# Patient Record
Sex: Male | Born: 1970 | Race: Black or African American | Hispanic: No | Marital: Single | Smoking: Current every day smoker
Health system: Southern US, Community
[De-identification: ages and names within clinical notes are randomized; demographics above are authoritative.]

---

## 1999-09-17 ENCOUNTER — Emergency Department (HOSPITAL_COMMUNITY): Admission: EM | Admit: 1999-09-17 | Discharge: 1999-09-18 | Payer: Self-pay | Admitting: Emergency Medicine

## 2001-04-28 ENCOUNTER — Emergency Department (HOSPITAL_COMMUNITY): Admission: EM | Admit: 2001-04-28 | Discharge: 2001-04-28 | Payer: Self-pay | Admitting: Emergency Medicine

## 2001-05-06 ENCOUNTER — Emergency Department (HOSPITAL_COMMUNITY): Admission: EM | Admit: 2001-05-06 | Discharge: 2001-05-06 | Payer: Self-pay | Admitting: Emergency Medicine

## 2007-11-16 ENCOUNTER — Ambulatory Visit: Payer: Self-pay | Admitting: Family Medicine

## 2009-10-26 ENCOUNTER — Emergency Department (HOSPITAL_COMMUNITY): Admission: EM | Admit: 2009-10-26 | Discharge: 2009-10-26 | Payer: Self-pay | Admitting: Emergency Medicine

## 2009-11-15 ENCOUNTER — Emergency Department (HOSPITAL_COMMUNITY)
Admission: EM | Admit: 2009-11-15 | Discharge: 2009-11-15 | Payer: Self-pay | Source: Home / Self Care | Admitting: Emergency Medicine

## 2012-09-28 ENCOUNTER — Ambulatory Visit: Payer: Self-pay

## 2014-11-06 ENCOUNTER — Emergency Department (HOSPITAL_COMMUNITY)
Admission: EM | Admit: 2014-11-06 | Discharge: 2014-11-06 | Disposition: A | Discharge status: Home / Self Care | Payer: Self-pay | Attending: Emergency Medicine | Admitting: Emergency Medicine

## 2014-11-06 ENCOUNTER — Encounter (HOSPITAL_COMMUNITY): Payer: Self-pay | Admitting: Emergency Medicine

## 2014-11-06 DIAGNOSIS — Z720 Tobacco use: Secondary | ICD-10-CM | POA: Insufficient documentation

## 2014-11-06 DIAGNOSIS — S0011XA Contusion of right eyelid and periocular area, initial encounter: Secondary | ICD-10-CM | POA: Insufficient documentation

## 2014-11-06 DIAGNOSIS — H05231 Hemorrhage of right orbit: Secondary | ICD-10-CM

## 2014-11-06 DIAGNOSIS — Y92002 Bathroom of unspecified non-institutional (private) residence single-family (private) house as the place of occurrence of the external cause: Secondary | ICD-10-CM | POA: Insufficient documentation

## 2014-11-06 DIAGNOSIS — Y9389 Activity, other specified: Secondary | ICD-10-CM | POA: Insufficient documentation

## 2014-11-06 DIAGNOSIS — W2209XA Striking against other stationary object, initial encounter: Secondary | ICD-10-CM | POA: Insufficient documentation

## 2014-11-06 DIAGNOSIS — Y998 Other external cause status: Secondary | ICD-10-CM | POA: Insufficient documentation

## 2014-11-06 MED ORDER — FLUORESCEIN SODIUM 1 MG OP STRP
1.0000 | ORAL_STRIP | Freq: Once | OPHTHALMIC | Status: AC
  Administered 2014-11-06: 1 via OPHTHALMIC
  Filled 2014-11-06: qty 1

## 2014-11-06 MED ORDER — TETRACAINE HCL 0.5 % OP SOLN
1.0000 [drp] | Freq: Once | OPHTHALMIC | Status: AC
  Administered 2014-11-06: 1 [drp] via OPHTHALMIC
  Filled 2014-11-06: qty 2
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
## 2014-11-06 NOTE — Discharge Instructions (Signed)
Eye Contusion °An eye contusion is a deep bruise of the eye. This is often called a "black eye." Contusions are the result of an injury that caused bleeding under the skin. The contusion may turn blue, purple, or yellow. Minor injuries will give you a painless contusion, but more severe contusions may stay painful and swollen for a few weeks. If the eye contusion only involves the eyelids and tissues around the eye, the injured area will get better within a few days to weeks. However, eye contusions can be serious and affect the eyeball and sight. °CAUSES  °· Blunt injury or trauma to the face or eye area. °· A forehead injury that causes the blood under the skin to work its way down to the eyelids. °· Rubbing the eyes due to irritation. °SYMPTOMS  °· Swelling and redness around the eye. °· Bruising around the eye. °· Tenderness, soreness, or pain around the eye. °· Blurry vision. °· Tearing. °· Eyeball redness. °DIAGNOSIS  °A diagnosis is usually based on a thorough exam of the eye and surrounding area. The eye must be looked at carefully to make sure it is not injured and to make sure nothing else will threaten your vision. A vision test may be done. An X-ray or computed tomography (CT) scan may be needed to determine if there are any associated injuries, such as broken bones (fractures). °TREATMENT  °If there is an injury to the eye, treatment will be determined by the nature of the injury. °HOME CARE INSTRUCTIONS  °· Put ice on the injured area. °¨ Put ice in a plastic bag. °¨ Place a towel between your skin and the bag. °¨ Leave the ice on for 15-20 minutes, 03-04 times a day. °· If it is determined that there is no injury to the eye, you may continue normal activities. °· Sunglasses may be worn to protect your eyes from bright light if light is uncomfortable. °· Sleep with your head elevated. You can put an extra pillow under your head. This may help with discomfort. °· Only take over-the-counter or  prescription medicines for pain, discomfort, or fever as directed by your caregiver. Do not take aspirin for the first few days. This may increase bruising. °SEEK IMMEDIATE MEDICAL CARE IF:  °· You have any form of vision loss. °· You have double vision. °· You feel nauseous. °· You feel dizzy, sleepy, or like you will faint. °· You have any fluid discharge from the eye or your nose. °· You have swelling and discoloration that does not fade. °MAKE SURE YOU:  °· Understand these instructions. °· Will watch your condition. °· Will get help right away if you are not doing well or get worse. °Document Released: 01/18/2000 Document Revised: 04/14/2011 Document Reviewed: 12/06/2010 °Exit[employer]® Patient Information ©2015 [employer]. This information is not intended to replace advice given to you by your health care provider. Make sure you discuss any questions you have with your health care provider. ° °

## 2016-07-31 DIAGNOSIS — Y999 Unspecified external cause status: Secondary | ICD-10-CM | POA: Insufficient documentation

## 2016-07-31 DIAGNOSIS — Y929 Unspecified place or not applicable: Secondary | ICD-10-CM | POA: Insufficient documentation

## 2016-07-31 DIAGNOSIS — W260XXA Contact with knife, initial encounter: Secondary | ICD-10-CM | POA: Insufficient documentation

## 2016-07-31 DIAGNOSIS — S41112A Laceration without foreign body of left upper arm, initial encounter: Secondary | ICD-10-CM | POA: Insufficient documentation

## 2016-07-31 DIAGNOSIS — F17200 Nicotine dependence, unspecified, uncomplicated: Secondary | ICD-10-CM | POA: Insufficient documentation

## 2016-07-31 DIAGNOSIS — Y939 Activity, unspecified: Secondary | ICD-10-CM | POA: Insufficient documentation

## 2016-08-01 ENCOUNTER — Emergency Department (HOSPITAL_COMMUNITY)
Admission: EM | Admit: 2016-08-01 | Discharge: 2016-08-01 | Disposition: A | Discharge status: Home / Self Care | Payer: Self-pay | Attending: Emergency Medicine | Admitting: Emergency Medicine

## 2016-08-01 ENCOUNTER — Emergency Department (HOSPITAL_COMMUNITY): Payer: Self-pay

## 2016-08-01 ENCOUNTER — Encounter (HOSPITAL_COMMUNITY): Payer: Self-pay | Admitting: Emergency Medicine

## 2016-08-01 DIAGNOSIS — Y939 Activity, unspecified: Secondary | ICD-10-CM | POA: Insufficient documentation

## 2016-08-01 DIAGNOSIS — M25552 Pain in left hip: Secondary | ICD-10-CM | POA: Insufficient documentation

## 2016-08-01 DIAGNOSIS — F17200 Nicotine dependence, unspecified, uncomplicated: Secondary | ICD-10-CM | POA: Insufficient documentation

## 2016-08-01 DIAGNOSIS — Y929 Unspecified place or not applicable: Secondary | ICD-10-CM | POA: Insufficient documentation

## 2016-08-01 DIAGNOSIS — W1830XA Fall on same level, unspecified, initial encounter: Secondary | ICD-10-CM | POA: Insufficient documentation

## 2016-08-01 DIAGNOSIS — Y999 Unspecified external cause status: Secondary | ICD-10-CM | POA: Insufficient documentation

## 2016-08-01 DIAGNOSIS — S41112A Laceration without foreign body of left upper arm, initial encounter: Secondary | ICD-10-CM

## 2016-08-01 MED ORDER — TETANUS-DIPHTH-ACELL PERTUSSIS 5-2.5-18.5 LF-MCG/0.5 IM SUSP
0.5000 mL | Freq: Once | INTRAMUSCULAR | Status: AC
  Administered 2016-08-01: 0.5 mL via INTRAMUSCULAR
  Filled 2016-08-01: qty 0.5

## 2016-08-01 MED ORDER — KETOROLAC TROMETHAMINE 15 MG/ML IJ SOLN
30.0000 mg | Freq: Once | INTRAMUSCULAR | Status: AC
  Administered 2016-08-01: 30 mg via INTRAMUSCULAR
  Filled 2016-08-01: qty 2

## 2016-08-01 MED ORDER — LIDOCAINE-EPINEPHRINE (PF) 2 %-1:200000 IJ SOLN
10.0000 mL | Freq: Once | INTRAMUSCULAR | Status: AC
  Administered 2016-08-01: 10 mL
  Filled 2016-08-01: qty 20

## 2016-08-01 MED ORDER — BACITRACIN ZINC 500 UNIT/GM EX OINT
TOPICAL_OINTMENT | CUTANEOUS | Status: AC
  Administered 2016-08-01: 03:00:00
  Filled 2016-08-01: qty 0.9

## 2016-08-01 MED ORDER — NAPROXEN 500 MG PO TABS
500.0000 mg | ORAL_TABLET | Freq: Once | ORAL | Status: AC
  Administered 2016-08-01: 500 mg via ORAL
  Filled 2016-08-01: qty 1

## 2016-08-01 NOTE — ED Notes (Signed)
Went out to lobby to bring pt in for triage. Pt cursing at registration and telling them to get back behind the desk. Ask pt to stop cursing and pt starts yelling and cursing for me to get him the f**k  where he needs to go and to shut up.

## 2016-08-01 NOTE — ED Notes (Signed)
Attempted to discharge patient but patient stated he could not walk and wanted to be evaluated for his leg. Provider notified, provider to see patient.
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
## 2016-08-01 NOTE — ED Notes (Signed)
I went in the room to help the patient because the call light was going off.  The patient stated that he needed to used the bathroom.  I ask the patient would he like to go the the restroom or used the urinal the patient just stared at me so I ask again then the patient stated to me that he could not walk so I gave him a urinal.  The patient ask me to leave the room and stated that I was a bitch.
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
## 2016-08-01 NOTE — ED Notes (Signed)
Bacitracin and telfa dressing applied to laceration

## 2016-08-01 NOTE — ED Triage Notes (Signed)
Pt reports that he tripped and fell down the stairs, injuring his left hip.  He reports that he is unable to stand on his hip.

## 2019-10-13 ENCOUNTER — Encounter (HOSPITAL_COMMUNITY): Payer: Self-pay

## 2019-10-13 ENCOUNTER — Other Ambulatory Visit: Payer: Self-pay

## 2019-10-13 ENCOUNTER — Ambulatory Visit (HOSPITAL_COMMUNITY)
Admission: EM | Admit: 2019-10-13 | Discharge: 2019-10-13 | Disposition: A | Discharge status: Home / Self Care | Payer: Self-pay | Attending: Family Medicine | Admitting: Family Medicine

## 2019-10-13 DIAGNOSIS — Z202 Contact with and (suspected) exposure to infections with a predominantly sexual mode of transmission: Secondary | ICD-10-CM | POA: Insufficient documentation

## 2019-10-13 DIAGNOSIS — R739 Hyperglycemia, unspecified: Secondary | ICD-10-CM | POA: Insufficient documentation

## 2019-10-13 DIAGNOSIS — R3911 Hesitancy of micturition: Secondary | ICD-10-CM | POA: Insufficient documentation

## 2019-10-13 LAB — HIV ANTIBODY (ROUTINE TESTING W REFLEX): HIV Screen 4th Generation wRfx: NONREACTIVE

## 2019-10-13 LAB — POCT URINALYSIS DIPSTICK, ED / UC
Bilirubin Urine: NEGATIVE
Glucose, UA: 500 mg/dL — AB
Ketones, ur: NEGATIVE mg/dL
Leukocytes,Ua: NEGATIVE
Nitrite: NEGATIVE
Protein, ur: NEGATIVE mg/dL
Specific Gravity, Urine: 1.025 (ref 1.005–1.030)
Urobilinogen, UA: 0.2 mg/dL (ref 0.0–1.0)
pH: 6.5 (ref 5.0–8.0)

## 2019-10-13 LAB — CBG MONITORING, ED: Glucose-Capillary: 190 mg/dL — ABNORMAL HIGH (ref 70–99)

## 2019-10-13 MED ORDER — METFORMIN HCL 500 MG PO TABS: 500.0000 mg | ORAL_TABLET | Freq: Every day | ORAL | 0 refills | Status: DC

## 2019-10-13 NOTE — Discharge Instructions (Addendum)
[facility] - [phone] [address] [place], [place], [place] [address] Call this clinic or another clinic in your area as soon as you are able to establish care and have your labs and medication monitored.

## 2019-10-13 NOTE — ED Triage Notes (Addendum)
Pt presents with concern for std. Reports hesitancy with urination. States he was potentially exposed to Trichomonas.
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
## 2019-10-14 LAB — RPR: RPR Ser Ql: NONREACTIVE

## 2019-10-14 LAB — CYTOLOGY, (ORAL, ANAL, URETHRAL) ANCILLARY ONLY
Chlamydia: NEGATIVE
Comment: NEGATIVE
Comment: NEGATIVE
Comment: NORMAL
Neisseria Gonorrhea: NEGATIVE
Trichomonas: NEGATIVE

## 2020-09-05 ENCOUNTER — Emergency Department (HOSPITAL_COMMUNITY)
Admission: EM | Admit: 2020-09-05 | Discharge: 2020-09-06 | Disposition: A | Discharge status: Home / Self Care | Payer: Self-pay | Attending: Emergency Medicine | Admitting: Emergency Medicine

## 2020-09-05 ENCOUNTER — Encounter (HOSPITAL_COMMUNITY): Payer: Self-pay

## 2020-09-05 ENCOUNTER — Other Ambulatory Visit: Payer: Self-pay

## 2020-09-05 DIAGNOSIS — S0181XA Laceration without foreign body of other part of head, initial encounter: Secondary | ICD-10-CM | POA: Insufficient documentation

## 2020-09-05 DIAGNOSIS — S01411A Laceration without foreign body of right cheek and temporomandibular area, initial encounter: Secondary | ICD-10-CM | POA: Insufficient documentation

## 2020-09-05 DIAGNOSIS — F17200 Nicotine dependence, unspecified, uncomplicated: Secondary | ICD-10-CM | POA: Insufficient documentation

## 2020-09-05 DIAGNOSIS — S300XXA Contusion of lower back and pelvis, initial encounter: Secondary | ICD-10-CM | POA: Insufficient documentation
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
## 2020-09-06 ENCOUNTER — Other Ambulatory Visit: Payer: Self-pay

## 2020-09-06 ENCOUNTER — Emergency Department (HOSPITAL_COMMUNITY): Payer: Self-pay

## 2020-09-06 MED ORDER — LIDOCAINE-EPINEPHRINE (PF) 2 %-1:200000 IJ SOLN
20.0000 mL | Freq: Once | INTRAMUSCULAR | Status: AC
  Administered 2020-09-06: 20 mL
  Filled 2020-09-06: qty 20

## 2020-09-06 MED ORDER — IBUPROFEN 400 MG PO TABS
600.0000 mg | ORAL_TABLET | Freq: Once | ORAL | Status: AC
  Administered 2020-09-06: 600 mg via ORAL
  Filled 2020-09-06: qty 1

## 2020-09-06 MED ORDER — CEPHALEXIN 500 MG PO CAPS: 500.0000 mg | ORAL_CAPSULE | Freq: Three times a day (TID) | ORAL | 0 refills | Status: DC
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
## 2020-09-06 NOTE — ED Notes (Signed)
Pt to CT

## 2020-09-06 NOTE — Discharge Instructions (Addendum)
Use Tylenol every 4 hours and ibuprofen every 6 hours as needed for pain.  Use ice regularly as needed.  Return for new or worsening signs or symptoms. Your sutures are absorbable and will break down on their own. Keep wounds clean and watch for signs of infection.  Antibiotic prescription given as discussed and as needed.

## 2021-10-08 ENCOUNTER — Encounter (HOSPITAL_COMMUNITY): Payer: Self-pay

## 2021-10-08 ENCOUNTER — Ambulatory Visit (HOSPITAL_COMMUNITY)
Admission: EM | Admit: 2021-10-08 | Discharge: 2021-10-08 | Disposition: A | Discharge status: Home / Self Care | Payer: Commercial Managed Care - HMO | Attending: Family Medicine | Admitting: Family Medicine

## 2021-10-08 ENCOUNTER — Encounter: Payer: Self-pay | Admitting: *Deleted

## 2021-10-08 DIAGNOSIS — R600 Localized edema: Secondary | ICD-10-CM | POA: Diagnosis not present

## 2021-10-08 NOTE — Congregational Nurse Program (Signed)
Pt c/o R side of jaw swelling when he eats, denies pain, dental pain. States only swells when he eats. Advised to go to urgent care for eval. Given bus passes

## 2021-10-08 NOTE — ED Triage Notes (Signed)
Pt states the past 3 days everytime he eats, he has swelling to rt side of neck for . Denies SOB or difficulty swallowing.

## 2021-10-08 NOTE — Discharge Instructions (Signed)
I think you are having some trouble with your salivary gland called the parotid gland swelling.  For 1 drink plenty of fluids  2--suck on hard candies that are little sour like lemon drops throughout the day.  This can help the parotid gland keep putting out its juices  Do salivary gland massage-see the illustration  You can use the QR code/website at the back of the summary paperwork to schedule yourself a new patient appointment with primary care

## 2021-10-08 NOTE — ED Provider Notes (Addendum)
[facility]    CSN: [id number] Arrival date & time: 10/08/21  1448      History   Chief Complaint Chief Complaint  Patient presents with   neck swelling        HPI Eric Monroe is a 51 y.o. male.   HPI Here for swelling in his right neck and jaw that has been going on for the last 3 days.  It will begin as he starts to eat and then continues for about 45 minutes and then resolves.  It is painful as he is chewing and feels tight.  It does not really cause shortness of breath and he does not have any tongue or lip swelling.  No itching or rash when this is going on.   He also has had no fever or cough or sore throat.  He has maybe had a little itching in his ear  History reviewed. No pertinent past medical history.  There are no problems to display for this patient.   History reviewed. No pertinent surgical history.     Home Medications    Prior to Admission medications   Not on File    Family History Family History  Problem Relation Age of Onset   Healthy Mother    Healthy Father     Social History Social History   Tobacco Use   Smoking status: Every Day   Smokeless tobacco: Never  Substance Use Topics   Alcohol use: Yes   Drug use: No     Allergies   Patient has no known allergies.   Review of Systems Review of Systems   Physical Exam Triage Vital Signs ED Triage Vitals  Enc Vitals Group     BP 10/08/21 1528 125/85     Pulse Rate 10/08/21 1528 (!) 48     Resp 10/08/21 1528 18     Temp 10/08/21 1528 98.4 F (36.9 C)     Temp Source 10/08/21 1528 Oral     SpO2 10/08/21 1528 98 %     Weight --      Height --      Head Circumference --      Peak Flow --      Pain Score 10/08/21 1529 0     Pain Loc --      Pain Edu? --      Excl. in GC? --    No data found.  Updated Vital Signs BP 125/85 (BP Location: Left Arm)   Pulse (!) 48   Temp 98.4 F (36.9 C) (Oral)   Resp 18   SpO2 98%   Visual Acuity Right Eye  Distance:   Left Eye Distance:   Bilateral Distance:    Right Eye Near:   Left Eye Near:    Bilateral Near:     Physical Exam Vitals reviewed.  Constitutional:      General: He is not in acute distress.    Appearance: He is not toxic-appearing.  HENT:     Right Ear: Tympanic membrane and ear canal normal.     Left Ear: Tympanic membrane and ear canal normal.     Nose: Nose normal.     Mouth/Throat:     Mouth: Mucous membranes are moist.     Pharynx: No oropharyngeal exudate or posterior oropharyngeal erythema.  Eyes:     Extraocular Movements: Extraocular movements intact.     Conjunctiva/sclera: Conjunctivae normal.     Pupils: Pupils are equal, round, and reactive to light.  Neck:     Comments: Currently has no adenopathy or swelling or mass of either side of his neck or jaw.  But he shows me where it does swell and it is around the angle of his jaw and just under his jaw. Cardiovascular:     Rate and Rhythm: Normal rate and regular rhythm.     Heart sounds: No murmur heard. Pulmonary:     Effort: Pulmonary effort is normal.     Breath sounds: Normal breath sounds.  Musculoskeletal:     Cervical back: Neck supple. No tenderness.  Lymphadenopathy:     Cervical: No cervical adenopathy.  Skin:    Capillary Refill: Capillary refill takes less than 2 seconds.     Coloration: Skin is not jaundiced or pale.     Findings: No rash.  Neurological:     General: No focal deficit present.     Mental Status: He is alert and oriented to person, place, and time.  Psychiatric:        Behavior: Behavior normal.      UC Treatments / Results  Labs (all labs ordered are listed, but only abnormal results are displayed) Labs Reviewed - No data to display  EKG   Radiology No results found.  Procedures Procedures (including critical care time)  Medications Ordered in UC Medications - No data to display  Initial Impression / Assessment and Plan / UC Course  I have reviewed  the triage vital signs and the nursing notes.  Pertinent labs & imaging results that were available during my care of the patient were reviewed by me and considered in my medical decision making (see chart for details).     I think he is describing some parotid gland swelling.  He is given instructions on salivary gland massage and he will start using some tart candies through the day.  Also I am asking him to maintain good hydration.  He is given contact information for ENT and assistance is requested to help him find a primary care doctor Final Clinical Impressions(s) / UC Diagnoses   Final diagnoses:  Swelling of right parotid gland     Discharge Instructions      I think you are having some trouble with your salivary gland called the parotid gland swelling.  For 1 drink plenty of fluids  2--suck on hard candies that are little sour like lemon drops throughout the day.  This can help the parotid gland keep putting out its juices  Do salivary gland massage-see the illustration  You can use the QR code/website at the back of the summary paperwork to schedule yourself a new patient appointment with primary care        ED Prescriptions   None    PDMP not reviewed this encounter.   Zenia Resides, MD 10/08/21 1554    Zenia Resides, MD 10/08/21 [phone]

## 2021-10-09 ENCOUNTER — Encounter: Payer: Self-pay | Admitting: Physician Assistant

## 2021-10-09 NOTE — Progress Notes (Signed)
Pt seen by Dr Delford Field.  C/o swelling R neck when he eats. Was seen in the ER, they want him to f/u with ENT.  No PCP  No pain at rest, no injury, no fevers, no dental pain but has very bad teeth.   Ibuprofen was helping but he is out.   Starts to swell when he is eating, gets bigger when he chews and even bigger after he swallows.   Sx resolve about 45" to and hour after he eats.   Most likely dx is salivary gland stone.   Seen in the ER, they recommended lemon drops or other candy, massage.   Today's Vitals   10/09/21 1611  BP: 119/73  Pulse: 79  SpO2: 96%   He was given ibuprofen as well.  Theodore Demark, PA-C 10/09/2021 4:15 PM
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
## 2022-01-15 ENCOUNTER — Encounter: Payer: Self-pay | Admitting: Critical Care Medicine

## 2022-01-16 NOTE — Progress Notes (Signed)
51 y.o.M New arrival to [facility]  Reminded pt he has new patient appt upcoming in Feb with Dr Laural Benes  His parotid gland has improved and resolved.  No specific concerns   BP 135/85  P 61  On no medications

## 2022-01-22 NOTE — Congregational Nurse Program (Signed)
  Dept: [phone]   Congregational Nurse Program Note  Date of Encounter: 01/09/2022  Clinic visit for assistance with obtaining a PCP.  Has history of back pain and right arm numbness at times.  Appointment made with Dr. Laural Benes on 03/24/2022 and is to come to [facility] MD on 12/8. Past Medical History: No past medical history on file.  Encounter Details:

## 2022-03-24 ENCOUNTER — Ambulatory Visit: Payer: Commercial Managed Care - HMO | Admitting: Internal Medicine
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
## 2024-02-01 ENCOUNTER — Other Ambulatory Visit: Payer: Self-pay

## 2024-02-01 ENCOUNTER — Encounter (HOSPITAL_COMMUNITY): Payer: Self-pay | Admitting: Emergency Medicine

## 2024-02-01 ENCOUNTER — Emergency Department (HOSPITAL_COMMUNITY): Payer: Self-pay

## 2024-02-01 ENCOUNTER — Emergency Department (HOSPITAL_COMMUNITY)
Admission: EM | Admit: 2024-02-01 | Discharge: 2024-02-01 | Disposition: A | Discharge status: Home / Self Care | Payer: Self-pay | Attending: Emergency Medicine | Admitting: Emergency Medicine

## 2024-02-01 DIAGNOSIS — R1032 Left lower quadrant pain: Secondary | ICD-10-CM | POA: Diagnosis not present

## 2024-02-01 DIAGNOSIS — J439 Emphysema, unspecified: Secondary | ICD-10-CM

## 2024-02-01 DIAGNOSIS — M47812 Spondylosis without myelopathy or radiculopathy, cervical region: Secondary | ICD-10-CM

## 2024-02-01 DIAGNOSIS — F10120 Alcohol abuse with intoxication, uncomplicated: Secondary | ICD-10-CM | POA: Diagnosis not present

## 2024-02-01 DIAGNOSIS — M542 Cervicalgia: Secondary | ICD-10-CM | POA: Insufficient documentation

## 2024-02-01 DIAGNOSIS — Y92410 Unspecified street and highway as the place of occurrence of the external cause: Secondary | ICD-10-CM | POA: Insufficient documentation

## 2024-02-01 DIAGNOSIS — Y906 Blood alcohol level of 120-199 mg/100 ml: Secondary | ICD-10-CM | POA: Diagnosis not present

## 2024-02-01 DIAGNOSIS — F10920 Alcohol use, unspecified with intoxication, uncomplicated: Secondary | ICD-10-CM

## 2024-02-01 LAB — COMPREHENSIVE METABOLIC PANEL WITH GFR
ALT: 33 U/L (ref 0–44)
AST: 55 U/L — ABNORMAL HIGH (ref 15–41)
Albumin: 4.2 g/dL (ref 3.5–5.0)
Alkaline Phosphatase: 114 U/L (ref 38–126)
Anion gap: 17 — ABNORMAL HIGH (ref 5–15)
BUN: 10 mg/dL (ref 6–20)
CO2: 20 mmol/L — ABNORMAL LOW (ref 22–32)
Calcium: 8.9 mg/dL (ref 8.9–10.3)
Chloride: 102 mmol/L (ref 98–111)
Creatinine, Ser: 0.96 mg/dL (ref 0.61–1.24)
GFR, Estimated: >60 mL/min
Glucose, Bld: 90 mg/dL (ref 70–99)
Potassium: 4.3 mmol/L (ref 3.5–5.1)
Sodium: 140 mmol/L (ref 135–145)
Total Bilirubin: 0.4 mg/dL (ref 0.0–1.2)
Total Protein: 8 g/dL (ref 6.5–8.1)

## 2024-02-01 LAB — ETHANOL: Alcohol, Ethyl (B): 146 mg/dL — ABNORMAL HIGH

## 2024-02-01 LAB — CBC
HCT: 46.8 % (ref 39.0–52.0)
Hemoglobin: 16.2 g/dL (ref 13.0–17.0)
MCH: 33.6 pg (ref 26.0–34.0)
MCHC: 34.6 g/dL (ref 30.0–36.0)
MCV: 97.1 fL (ref 80.0–100.0)
Platelets: 272 K/uL (ref 150–400)
RBC: 4.82 MIL/uL (ref 4.22–5.81)
RDW: 11.8 % (ref 11.5–15.5)
WBC: 8.1 K/uL (ref 4.0–10.5)
nRBC: 0 % (ref 0.0–0.2)

## 2024-02-01 LAB — SAMPLE TO BLOOD BANK

## 2024-02-01 LAB — PROTIME-INR
INR: 1 (ref 0.8–1.2)
Prothrombin Time: 13.8 s (ref 11.4–15.2)

## 2024-02-01 MED ORDER — ZIPRASIDONE MESYLATE 20 MG IM SOLR: 20.0000 mg | Freq: Once | INTRAMUSCULAR | Status: DC

## 2024-02-01 MED ORDER — METHOCARBAMOL 500 MG PO TABS: 500.0000 mg | ORAL_TABLET | Freq: Two times a day (BID) | ORAL | 0 refills | Status: AC | PRN

## 2024-02-01 MED ORDER — ZIPRASIDONE MESYLATE 20 MG IM SOLR
20.0000 mg | Freq: Once | INTRAMUSCULAR | Status: DC
  Filled 2024-02-01: qty 20

## 2024-02-01 MED ORDER — NAPROXEN 500 MG PO TABS: 500.0000 mg | ORAL_TABLET | Freq: Two times a day (BID) | ORAL | 0 refills | Status: AC | PRN

## 2024-02-01 MED ORDER — LORAZEPAM 2 MG/ML IJ SOLN: 2.0000 mg | Freq: Once | INTRAMUSCULAR | Status: DC

## 2024-02-01 MED ORDER — HALOPERIDOL LACTATE 5 MG/ML IJ SOLN: 5.0000 mg | Freq: Once | INTRAMUSCULAR | Status: DC

## 2024-02-01 MED ORDER — STERILE WATER FOR INJECTION IJ SOLN
INTRAMUSCULAR | Status: AC
  Filled 2024-02-01: qty 10

## 2024-02-01 NOTE — ED Notes (Signed)
 EDP at [facility]

## 2024-02-01 NOTE — ED Notes (Signed)
 PT ivc PAPERWORK HAS BEEN RESCINDED
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
## 2024-02-01 NOTE — ED Notes (Signed)
 Pt. Is now allowing us  to get CT scans

## 2024-02-01 NOTE — Progress Notes (Signed)
 Orthopedic Tech Progress Note Patient Details:  Eric Monroe 04-11-1970 [id number]  Patient ID: Eric Monroe, male   DOB: [birth year], 53 y.o.   MRN: [id number] I attended trauma page. Chandra Dorn PARAS 02/01/2024, 4:56 AM

## 2024-02-01 NOTE — ED Notes (Addendum)
 Patient refusing treatment at this time. Refusing blood work, pulse ox, and temperature. Lawsing at bedside.
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
## 2024-02-01 NOTE — ED Notes (Signed)
 Patient accepted blood draw from phlebectomy but refusing IV from RN. Awaiting IVC in order to give Geodon.

## 2024-02-01 NOTE — ED Notes (Signed)
 Patient sitting on the side of his bed. Patient states I can not wait on the doctor any longer Patient educated that he is under an IVC and has to stay here.

## 2024-02-01 NOTE — ED Notes (Signed)
 Case number -[id number]
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
## 2024-02-01 NOTE — ED Notes (Signed)
"  Patient refused EKG  "

## 2024-02-01 NOTE — ED Notes (Signed)
 Declines labs, only allowed RN to obtain BP but would not allow pulse oximeter, temperature. MD at bedside discussing risks of declining care.

## 2024-02-01 NOTE — ED Notes (Signed)
 Patient continuing to refuse treatment. Lengthy discussions with Dr. Jerrol regarding options. Dr. Bari brought to bedside to assist in determining capacity.

## 2024-02-01 NOTE — ED Notes (Signed)
 Pt. Refusing IV; Educated pt. On the reason for the IV was for his CT scan, but pt, still refusing; EDP made aware

## 2024-02-01 NOTE — ED Notes (Signed)
 Envelope Submitted Envelope [id number] has been submitted successfully.

## 2024-02-01 NOTE — ED Notes (Signed)
 Pt. Was taken over to CT, but once he got in there he said that he was scared of the machine and did not want to get cancer from the radiation.

## 2024-02-01 NOTE — ED Notes (Signed)
"  Patient refused discharge vitals.    "

## 2024-02-01 NOTE — ED Notes (Signed)
 MD continues to discuss recommended plan of care with patient, who requests transfer to [facility]. Declines care at this time. MD has not completed IVC as of yet. Unable to obtain labs/imaging d/t patient refusal. Alert x3 but occasional repetitive questioning, asking for his phone.

## 2024-02-03 LAB — I-STAT CHEM 8, ED
BUN: 11 mg/dL (ref 6–20)
Calcium, Ion: 1.02 mmol/L — ABNORMAL LOW (ref 1.15–1.40)
Chloride: 103 mmol/L (ref 98–111)
Creatinine, Ser: 1.1 mg/dL (ref 0.61–1.24)
Glucose, Bld: 91 mg/dL (ref 70–99)
HCT: 47 % (ref 39.0–52.0)
Hemoglobin: 16 g/dL (ref 13.0–17.0)
Potassium: 4.2 mmol/L (ref 3.5–5.1)
Sodium: 139 mmol/L (ref 135–145)
TCO2: 21 mmol/L — ABNORMAL LOW (ref 22–32)

## 2024-02-03 LAB — I-STAT CG4 LACTIC ACID, ED: Lactic Acid, Venous: 3.2 mmol/L (ref 0.5–1.9)
